# Patient Record
Sex: Male | Born: 1980 | Race: White | Hispanic: No | Marital: Single | Smoking: Current every day smoker
Health system: Southern US, Community
[De-identification: ages and names within clinical notes are randomized; demographics above are authoritative.]

---

## 1999-11-13 ENCOUNTER — Emergency Department (HOSPITAL_COMMUNITY): Admission: EM | Admit: 1999-11-13 | Discharge: 1999-11-13 | Payer: Self-pay | Admitting: Emergency Medicine

## 1999-11-13 ENCOUNTER — Encounter: Payer: Self-pay | Admitting: Emergency Medicine

## 2000-01-04 ENCOUNTER — Ambulatory Visit (HOSPITAL_COMMUNITY): Admission: EM | Admit: 2000-01-04 | Discharge: 2000-01-04 | Payer: Self-pay | Admitting: Emergency Medicine

## 2000-01-04 ENCOUNTER — Encounter: Payer: Self-pay | Admitting: Emergency Medicine

## 2000-12-11 ENCOUNTER — Encounter: Admission: RE | Admit: 2000-12-11 | Discharge: 2000-12-11 | Payer: Self-pay | Admitting: *Deleted

## 2000-12-11 ENCOUNTER — Encounter: Payer: Self-pay | Admitting: *Deleted

## 2001-02-22 ENCOUNTER — Encounter: Admission: RE | Admit: 2001-02-22 | Discharge: 2001-03-23 | Payer: Self-pay | Admitting: *Deleted

## 2002-11-23 ENCOUNTER — Emergency Department (HOSPITAL_COMMUNITY): Admission: AD | Admit: 2002-11-23 | Discharge: 2002-11-23 | Payer: Self-pay | Admitting: Emergency Medicine

## 2013-08-20 ENCOUNTER — Emergency Department (HOSPITAL_BASED_OUTPATIENT_CLINIC_OR_DEPARTMENT_OTHER): Payer: Self-pay

## 2013-08-20 ENCOUNTER — Emergency Department (HOSPITAL_BASED_OUTPATIENT_CLINIC_OR_DEPARTMENT_OTHER)
Admission: EM | Admit: 2013-08-20 | Discharge: 2013-08-20 | Disposition: A | Discharge status: Home / Self Care | Payer: Self-pay | Attending: Emergency Medicine | Admitting: Emergency Medicine

## 2013-08-20 ENCOUNTER — Encounter (HOSPITAL_BASED_OUTPATIENT_CLINIC_OR_DEPARTMENT_OTHER): Payer: Self-pay | Admitting: Emergency Medicine

## 2013-08-20 DIAGNOSIS — Y929 Unspecified place or not applicable: Secondary | ICD-10-CM | POA: Insufficient documentation

## 2013-08-20 DIAGNOSIS — W208XXA Other cause of strike by thrown, projected or falling object, initial encounter: Secondary | ICD-10-CM | POA: Insufficient documentation

## 2013-08-20 DIAGNOSIS — S9030XA Contusion of unspecified foot, initial encounter: Secondary | ICD-10-CM | POA: Insufficient documentation

## 2013-08-20 DIAGNOSIS — F17200 Nicotine dependence, unspecified, uncomplicated: Secondary | ICD-10-CM | POA: Insufficient documentation

## 2013-08-20 DIAGNOSIS — Y9389 Activity, other specified: Secondary | ICD-10-CM | POA: Insufficient documentation

## 2013-08-20 MED ORDER — HYDROCODONE-ACETAMINOPHEN 5-325 MG PO TABS: 1.0000 | ORAL_TABLET | Freq: Four times a day (QID) | ORAL | Status: AC | PRN

## 2013-08-20 NOTE — ED Notes (Signed)
Pt reports (R) foot injury on Friday after dropping something on his foot.  States that he walked on it all weekend and now has visible swelling and pain.  Pulses 2+

## 2013-08-20 NOTE — Discharge Instructions (Signed)
Foot Contusion °A foot contusion is a deep bruise to the foot. Contusions are the result of an injury that caused bleeding under the skin. The contusion may turn blue, purple, or yellow. Minor injuries will give you a painless contusion, but more severe contusions may stay painful and swollen for a few weeks. °CAUSES  °A foot contusion comes from a direct blow to that area, such as a heavy object falling on the foot. °SYMPTOMS  °· Swelling of the foot. °· Discoloration of the foot. °· Tenderness or soreness of the foot. °DIAGNOSIS  °You will have a physical exam and will be asked about your history. You may need an X-ray of your foot to look for a broken bone (fracture).  °TREATMENT  °An elastic wrap may be recommended to support your foot. Resting, elevating, and applying cold compresses to your foot are often the best treatments for a foot contusion. Over-the-counter medicines may also be recommended for pain control. °HOME CARE INSTRUCTIONS  °· Put ice on the injured area. °· Put ice in a plastic bag. °· Place a towel between your skin and the bag. °· Leave the ice on for 15-20 minutes, 03-04 times a day. °· Only take over-the-counter or prescription medicines for pain, discomfort, or fever as directed by your caregiver. °· If told, use an elastic wrap as directed. This can help reduce swelling. You may remove the wrap for sleeping, showering, and bathing. If your toes become numb, cold, or blue, take the wrap off and reapply it more loosely. °· Elevate your foot with pillows to reduce swelling. °· Try to avoid standing or walking while the foot is painful. Do not resume use until instructed by your caregiver. Then, begin use gradually. If pain develops, decrease use. Gradually increase activities that do not cause discomfort until you have normal use of your foot. °· See your caregiver as directed. It is very important to keep all follow-up appointments in order to avoid any lasting problems with your foot,  including long-term (chronic) pain. °SEEK IMMEDIATE MEDICAL CARE IF:  °· You have increased redness, swelling, or pain in your foot. °· Your swelling or pain is not relieved with medicines. °· You have loss of feeling in your foot or are unable to move your toes. °· Your foot turns cold or blue. °· You have pain when you move your toes. °· Your foot becomes warm to the touch. °· Your contusion does not improve in 2 days. °MAKE SURE YOU:  °· Understand these instructions. °· Will watch your condition. °· Will get help right away if you are not doing well or get worse. °Document Released: 01/10/2006 Document Revised: 09/20/2011 Document Reviewed: 02/22/2011 °[employer]® Patient Information ©2014 [employer]. ° °

## 2013-08-20 NOTE — ED Provider Notes (Signed)
CSN: [id number]     Arrival date & time 08/20/13  1200 History   First MD Initiated Contact with Patient 08/20/13 1242     Chief Complaint  Patient presents with  . Foot Injury     (Consider location/radiation/quality/duration/timing/severity/associated sxs/prior Treatment) Patient is a 33 y.o. male presenting with foot injury.  Foot Injury  Pt reports about a week ago he dropped a piece of metal on the top of his right foot. He has had persistent pain since then, particularly with walking. States he was walking around a lot at the [place] this past weekend and made things worse.   History reviewed. No pertinent past medical history. History reviewed. No pertinent past surgical history. History reviewed. No pertinent family history. History  Substance Use Topics  . Smoking status: Current Every Day Smoker -- 0.50 packs/day    Types: Cigarettes  . Smokeless tobacco: Not on file  . Alcohol Use: No    Review of Systems All other systems reviewed and are negative except as noted in HPI.     Allergies  Review of patient's allergies indicates no known allergies.  Home Medications   Prior to Admission medications   Not on File   BP 140/79  Pulse 104  Temp(Src) 98.2 F (36.8 C) (Oral)  Resp 18  Ht 6' (1.829 m)  Wt 210 lb (95.255 kg)  BMI 28.47 kg/m2  SpO2 99% Physical Exam  Constitutional: He is oriented to person, place, and time. He appears well-developed and well-nourished.  HENT:  Head: Normocephalic and atraumatic.  Neck: Neck supple.  Pulmonary/Chest: Effort normal.  Musculoskeletal: He exhibits edema and tenderness.  Tenderness and swelling over the distal dorsal R foot, no deformity, erythema or warmth  Neurological: He is alert and oriented to person, place, and time. No cranial nerve deficit.  Psychiatric: He has a normal mood and affect. His behavior is normal.    ED Course  Procedures (including critical care time) Labs Review Labs Reviewed - No  data to display  Imaging Review Dg Foot Complete Right  08/20/2013   CLINICAL DATA:  Pain post trauma  EXAM: RIGHT FOOT COMPLETE - 3+ VIEW  COMPARISON:  None.  FINDINGS: Frontal, oblique, and lateral views were obtained. There is no fracture or dislocation. Joint spaces appear intact. No erosive change.  IMPRESSION: No abnormality noted.   Electronically Signed   By: Bretta BangWilliam  Woodruff M.D.   On: 08/20/2013 12:50     EKG Interpretation None      MDM   Final diagnoses:  Contusion of foot   Foot contusion with neg xray. Advised to stay off foot as much as possible.     Charles B. Bernette MayersSheldon, MD 08/20/13 1328

## 2014-10-10 IMAGING — CR DG FOOT COMPLETE 3+V*R*
3 series · 3 of 3 positions shown · non-contrast
Comparison: None.

CLINICAL DATA: Pain post trauma

EXAM:
RIGHT FOOT COMPLETE - 3+ VIEW

[t foot ap right]
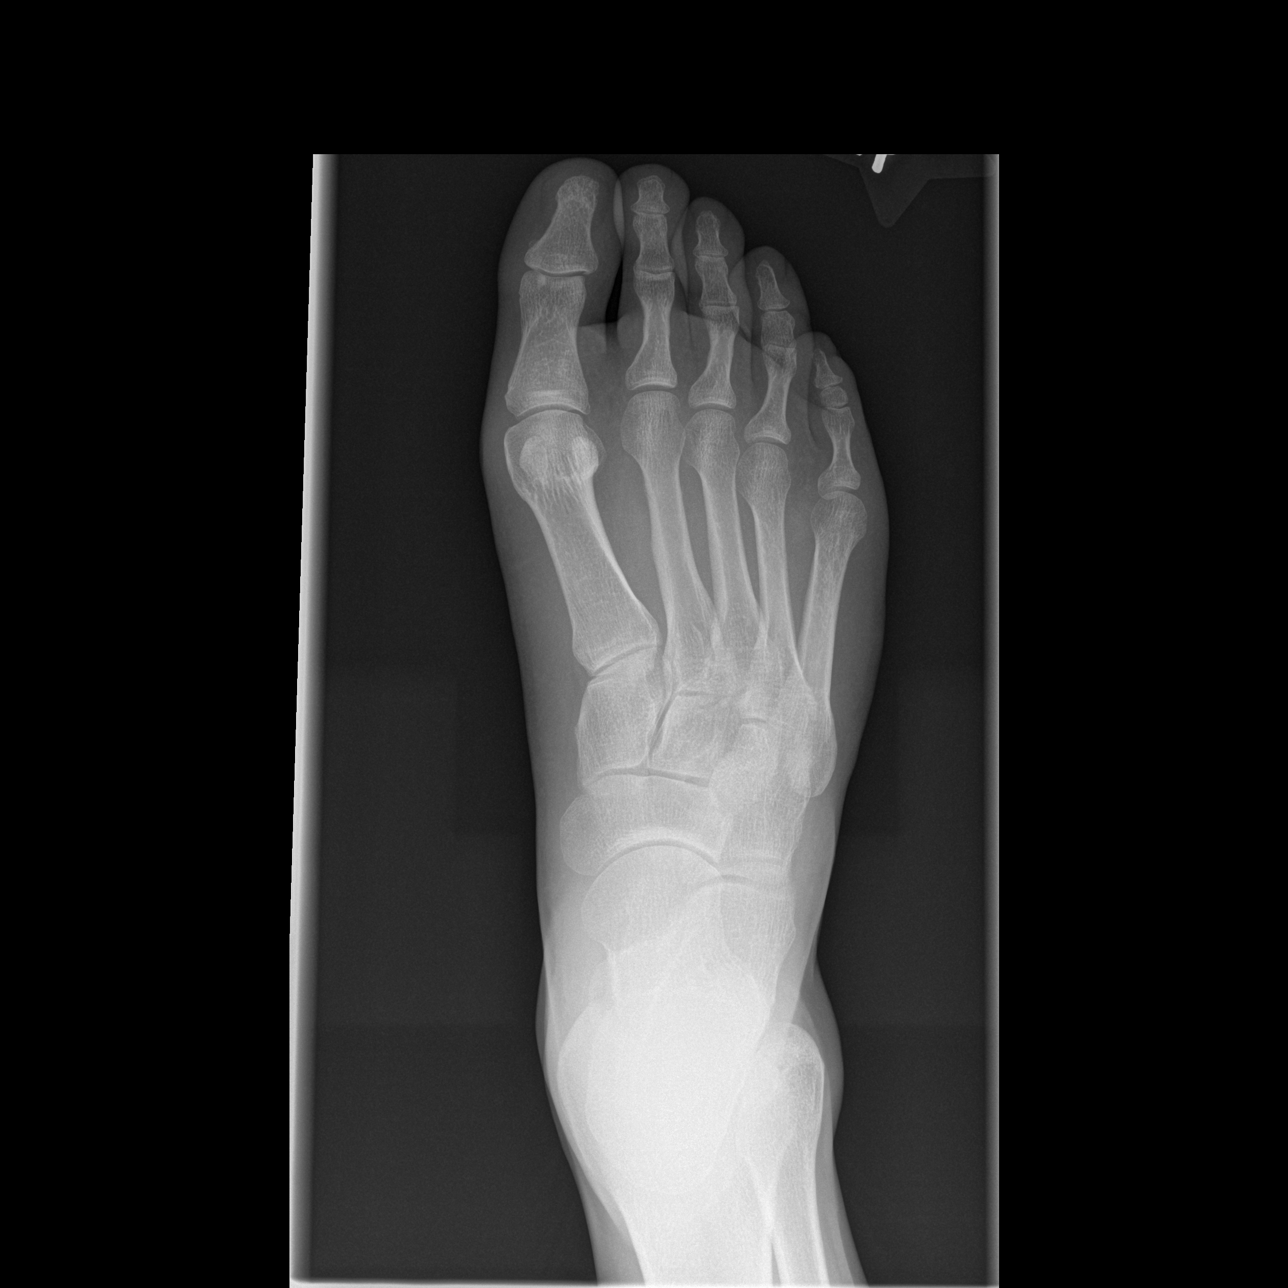

[t foot oblique right]
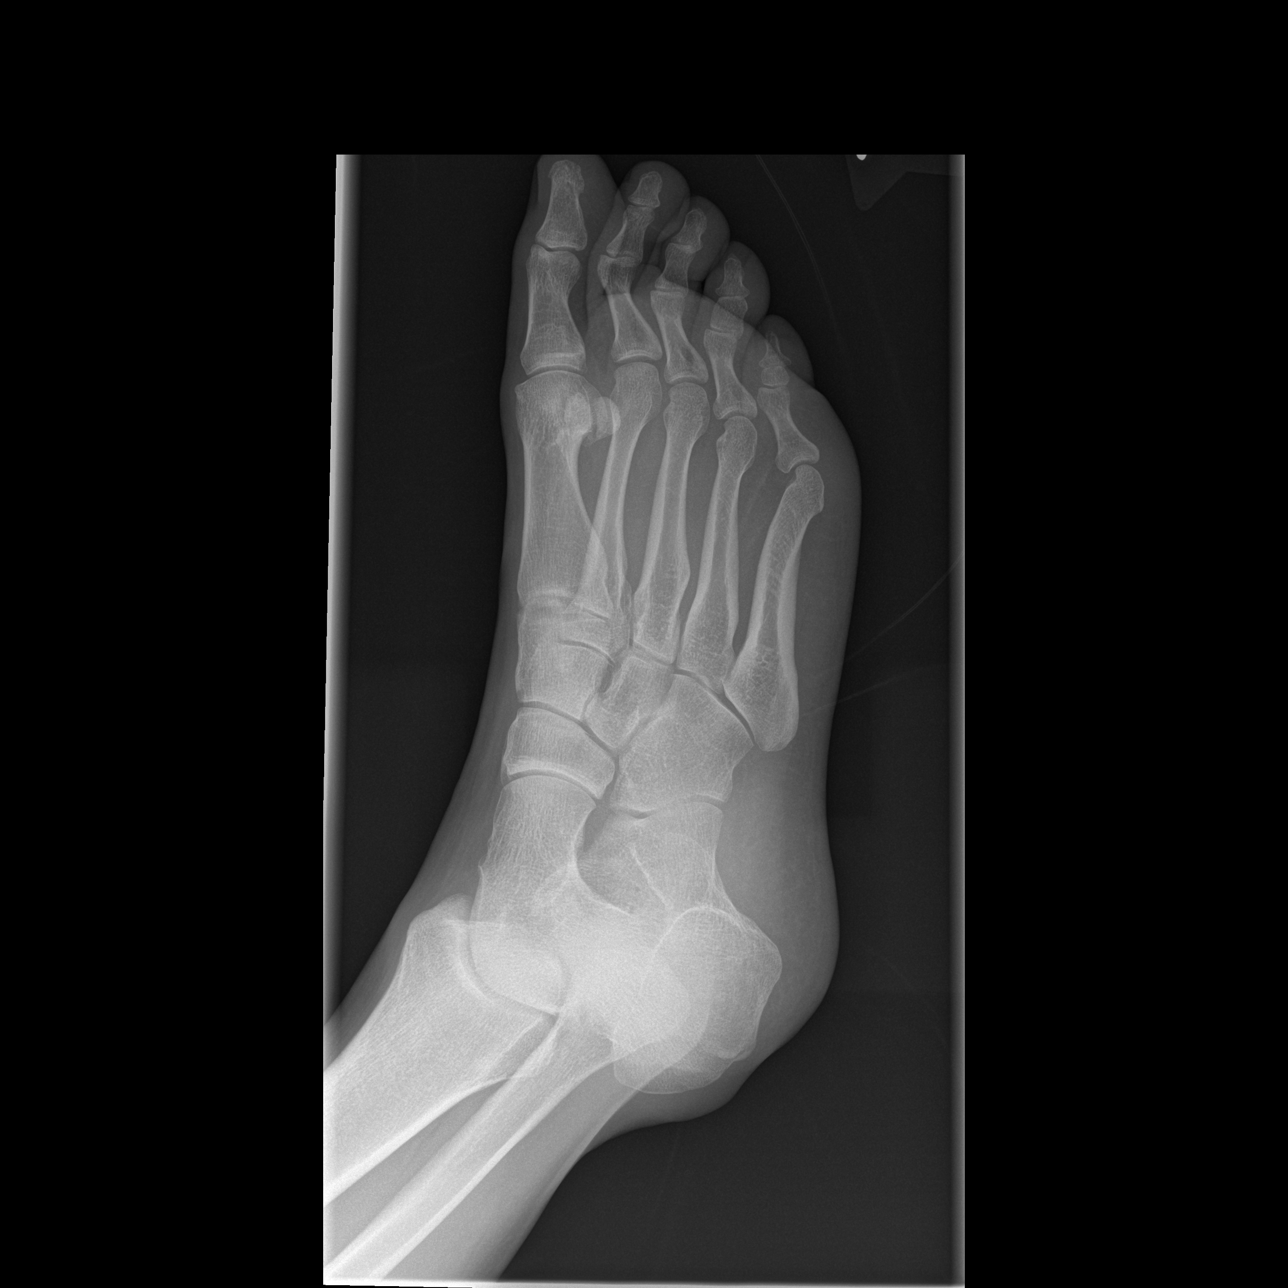

[t foot lat right]
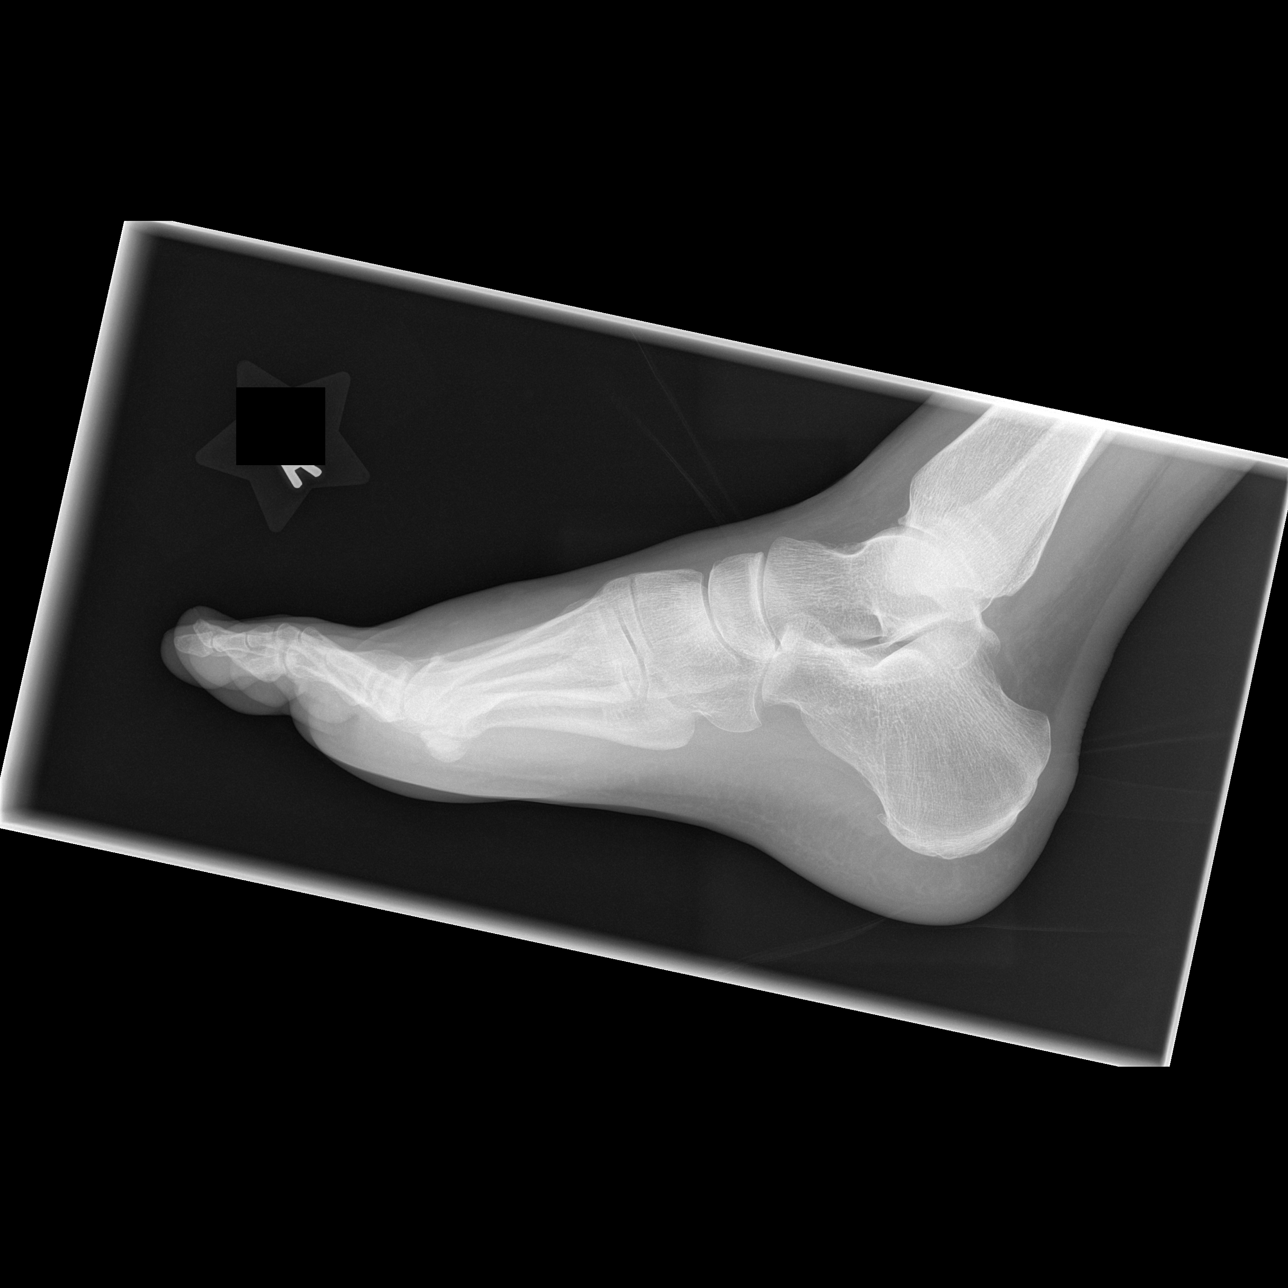

[3 of 3 positions shown; findings below may reference images not displayed]

FINDINGS: Frontal, oblique, and lateral views were obtained. There is no
fracture or dislocation. Joint spaces appear intact. No erosive
change.
IMPRESSION: No abnormality noted.
